# Patient Record
Sex: Male | Born: 2000 | Race: White | Hispanic: No | Marital: Single | Smoking: Never smoker
Health system: Southern US, Community
[De-identification: ages and names within clinical notes are randomized; demographics above are authoritative.]

---
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
## 2021-01-24 IMAGING — DX DG FOOT COMPLETE 3+V*R*
3 series · 3 of 3 positions shown · non-contrast
Comparison: None.

CLINICAL DATA: Pain after hit by shopping cart

EXAM:
RIGHT FOOT COMPLETE - 3+ VIEW

[foot ap]
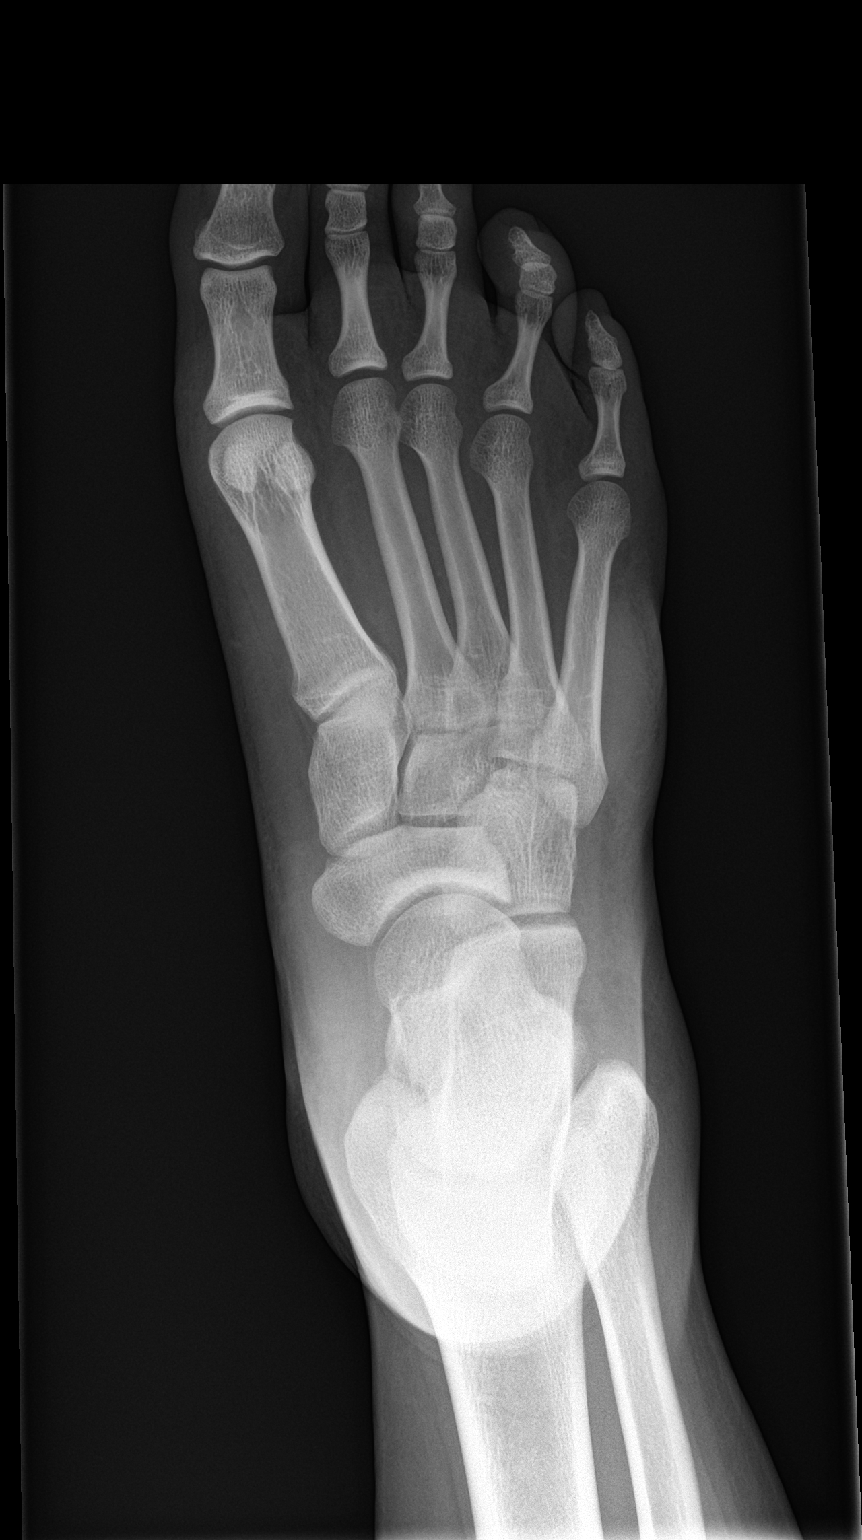

[foot obl]
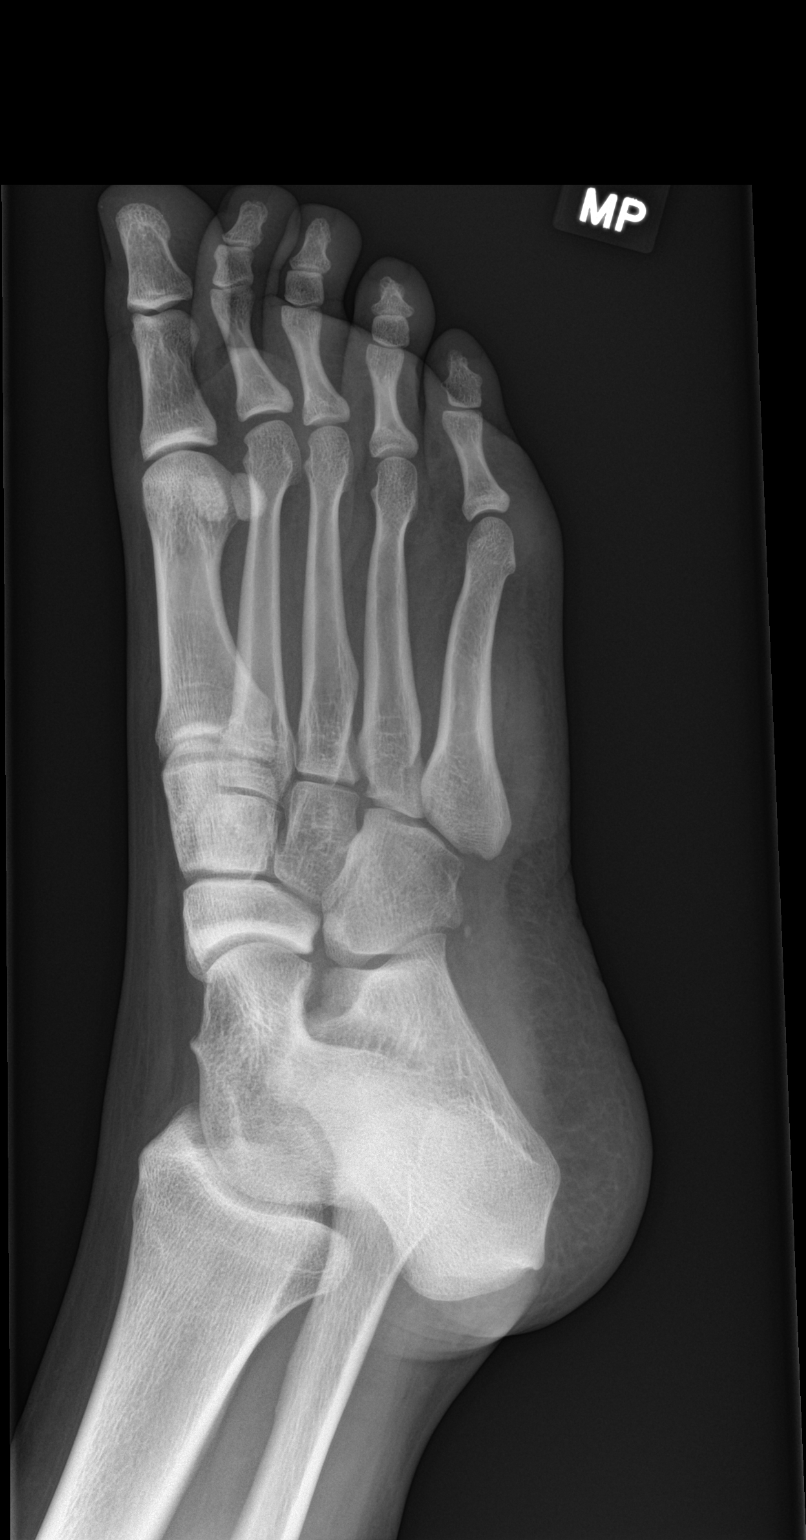

[foot lat]
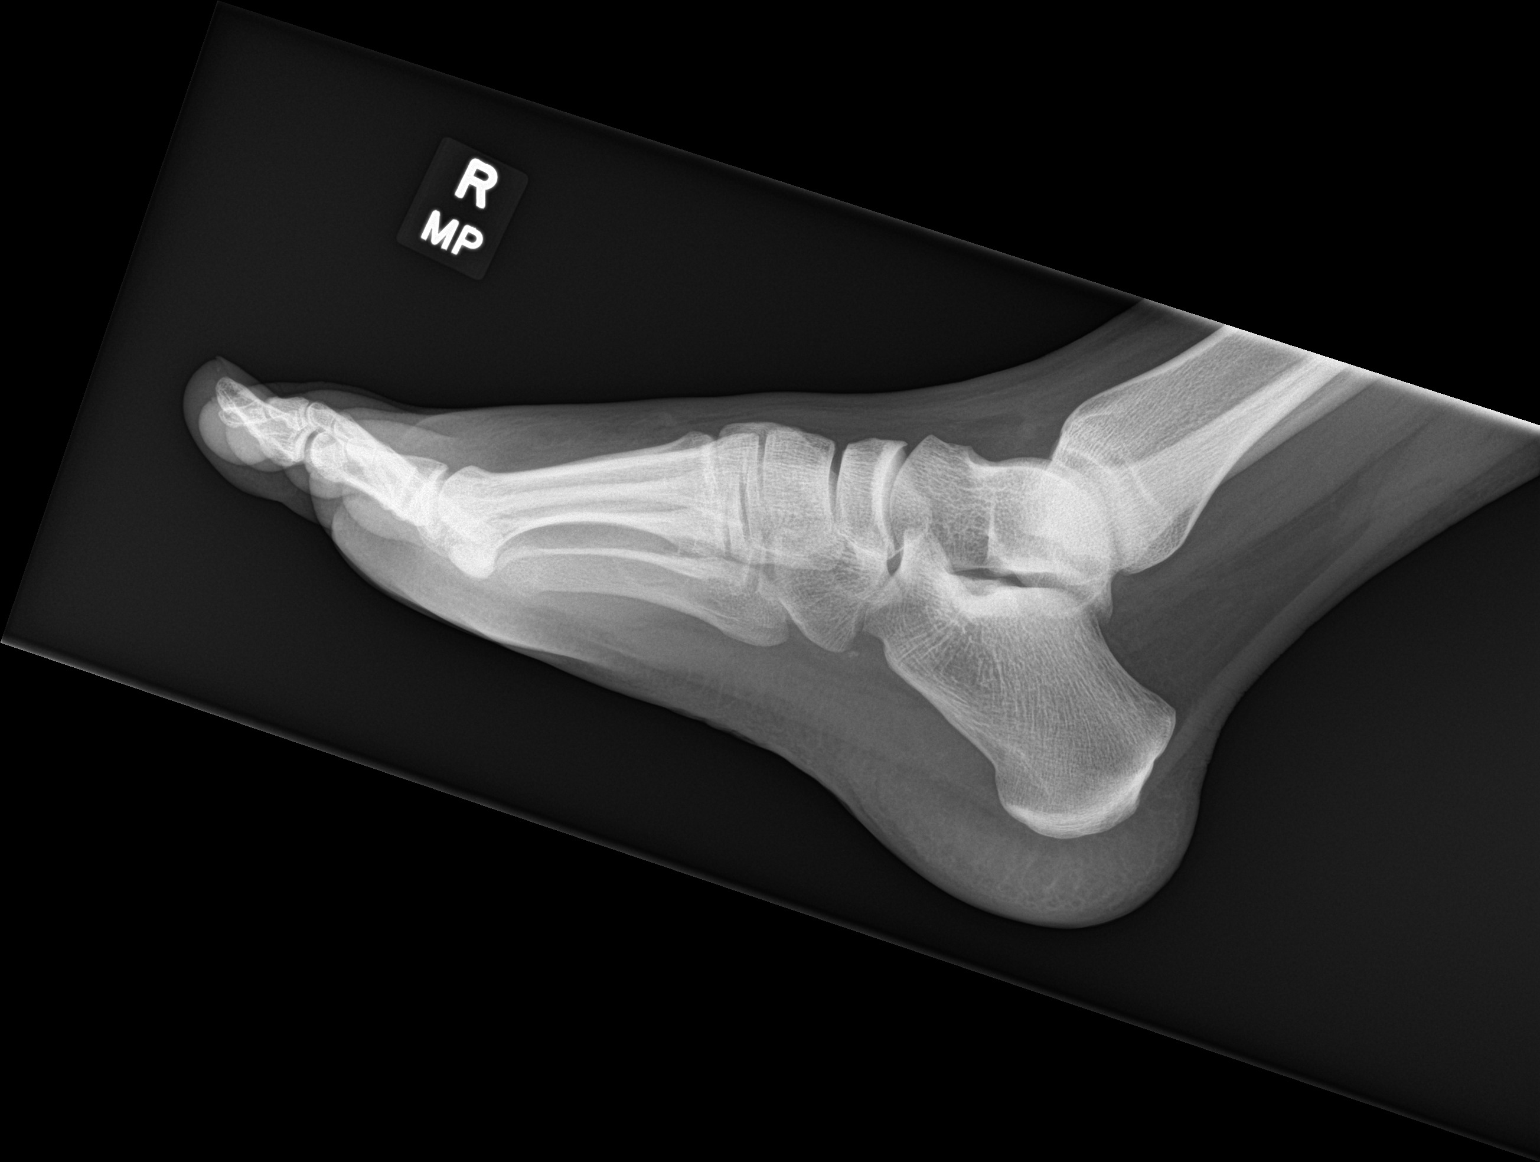

[3 of 3 positions shown; findings below may reference images not displayed]

FINDINGS: Frontal, oblique, and lateral views were obtained. No fracture or
dislocation. Joint spaces appear normal. No erosive change.
IMPRESSION: No fracture or dislocation.  No evident arthropathy.
